# Patient Record
Sex: Male | Born: 1995 | Race: Black or African American | Hispanic: No | Marital: Single | Smoking: Current every day smoker
Health system: Southern US, Community
[De-identification: ages and names within clinical notes are randomized; demographics above are authoritative.]

---
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
## 2016-10-12 ENCOUNTER — Emergency Department (HOSPITAL_COMMUNITY)
Admission: EM | Admit: 2016-10-12 | Discharge: 2016-10-13 | Disposition: A | Discharge status: Home / Self Care | Payer: No Typology Code available for payment source | Attending: Emergency Medicine | Admitting: Emergency Medicine

## 2016-10-12 ENCOUNTER — Encounter (HOSPITAL_COMMUNITY): Payer: Self-pay | Admitting: Emergency Medicine

## 2016-10-12 ENCOUNTER — Emergency Department (HOSPITAL_COMMUNITY): Payer: No Typology Code available for payment source

## 2016-10-12 DIAGNOSIS — S0081XA Abrasion of other part of head, initial encounter: Secondary | ICD-10-CM | POA: Insufficient documentation

## 2016-10-12 DIAGNOSIS — S99921A Unspecified injury of right foot, initial encounter: Secondary | ICD-10-CM | POA: Diagnosis present

## 2016-10-12 DIAGNOSIS — S20212A Contusion of left front wall of thorax, initial encounter: Secondary | ICD-10-CM | POA: Insufficient documentation

## 2016-10-12 DIAGNOSIS — S93601A Unspecified sprain of right foot, initial encounter: Secondary | ICD-10-CM | POA: Insufficient documentation

## 2016-10-12 DIAGNOSIS — S20311A Abrasion of right front wall of thorax, initial encounter: Secondary | ICD-10-CM | POA: Diagnosis not present

## 2016-10-12 DIAGNOSIS — F17210 Nicotine dependence, cigarettes, uncomplicated: Secondary | ICD-10-CM | POA: Insufficient documentation

## 2016-10-12 DIAGNOSIS — S80811A Abrasion, right lower leg, initial encounter: Secondary | ICD-10-CM

## 2016-10-12 DIAGNOSIS — Y939 Activity, unspecified: Secondary | ICD-10-CM | POA: Insufficient documentation

## 2016-10-12 DIAGNOSIS — M791 Myalgia: Secondary | ICD-10-CM | POA: Insufficient documentation

## 2016-10-12 DIAGNOSIS — Y999 Unspecified external cause status: Secondary | ICD-10-CM | POA: Diagnosis not present

## 2016-10-12 DIAGNOSIS — Y92410 Unspecified street and highway as the place of occurrence of the external cause: Secondary | ICD-10-CM | POA: Diagnosis not present

## 2016-10-12 MED ORDER — METHOCARBAMOL 500 MG PO TABS: 500.0000 mg | ORAL_TABLET | Freq: Three times a day (TID) | ORAL | 0 refills | Status: DC | PRN

## 2016-10-12 MED ORDER — OXYCODONE-ACETAMINOPHEN 5-325 MG PO TABS
1.0000 | ORAL_TABLET | Freq: Once | ORAL | Status: AC
  Administered 2016-10-12: 1 via ORAL
  Filled 2016-10-12: qty 1
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
## 2016-10-13 ENCOUNTER — Telehealth: Payer: Self-pay | Admitting: *Deleted

## 2016-10-13 NOTE — ED Notes (Signed)
Pt alert & oriented x4, stable gait. Patient given discharge instructions, paperwork & prescription(s). Patient verbalized understanding. Pt left department in wheelchair escorted by staff. Pt left w/ no further questions. 
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
## 2016-11-01 ENCOUNTER — Other Ambulatory Visit (HOSPITAL_COMMUNITY): Payer: Self-pay | Admitting: Orthopaedic Surgery

## 2016-11-01 ENCOUNTER — Ambulatory Visit (HOSPITAL_COMMUNITY)
Admission: RE | Admit: 2016-11-01 | Discharge: 2016-11-01 | Disposition: A | Discharge status: Home / Self Care | Payer: Medicaid Other | Source: Ambulatory Visit | Attending: Orthopaedic Surgery | Admitting: Orthopaedic Surgery

## 2016-11-01 DIAGNOSIS — M542 Cervicalgia: Secondary | ICD-10-CM | POA: Diagnosis present
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
## 2018-03-25 ENCOUNTER — Emergency Department (HOSPITAL_COMMUNITY)
Admission: EM | Admit: 2018-03-25 | Discharge: 2018-03-25 | Disposition: A | Discharge status: Home / Self Care | Payer: Medicaid Other | Attending: Emergency Medicine | Admitting: Emergency Medicine

## 2018-03-25 ENCOUNTER — Encounter (HOSPITAL_COMMUNITY): Payer: Self-pay | Admitting: Emergency Medicine

## 2018-03-25 ENCOUNTER — Other Ambulatory Visit: Payer: Self-pay

## 2018-03-25 ENCOUNTER — Emergency Department (HOSPITAL_COMMUNITY): Payer: Medicaid Other

## 2018-03-25 DIAGNOSIS — N5089 Other specified disorders of the male genital organs: Secondary | ICD-10-CM | POA: Insufficient documentation

## 2018-03-25 DIAGNOSIS — N453 Epididymo-orchitis: Secondary | ICD-10-CM | POA: Insufficient documentation

## 2018-03-25 DIAGNOSIS — F17210 Nicotine dependence, cigarettes, uncomplicated: Secondary | ICD-10-CM | POA: Insufficient documentation

## 2018-03-25 LAB — URINALYSIS, ROUTINE W REFLEX MICROSCOPIC
Bilirubin Urine: NEGATIVE
GLUCOSE, UA: NEGATIVE mg/dL
HGB URINE DIPSTICK: NEGATIVE
Ketones, ur: NEGATIVE mg/dL
Nitrite: NEGATIVE
Protein, ur: NEGATIVE mg/dL
SPECIFIC GRAVITY, URINE: 1.023 (ref 1.005–1.030)
pH: 6 (ref 5.0–8.0)

## 2018-03-25 MED ORDER — STERILE WATER FOR INJECTION IJ SOLN
INTRAMUSCULAR | Status: DC
Start: 2018-03-25 — End: 2018-03-26
  Filled 2018-03-25: qty 10

## 2018-03-25 MED ORDER — DOXYCYCLINE HYCLATE 100 MG PO CAPS: 100.0000 mg | ORAL_CAPSULE | Freq: Two times a day (BID) | ORAL | 0 refills | Status: DC

## 2018-03-25 MED ORDER — CEFTRIAXONE SODIUM 250 MG IJ SOLR
250.0000 mg | Freq: Once | INTRAMUSCULAR | Status: AC
Start: 2018-03-25 — End: 2018-03-25
  Administered 2018-03-25: 250 mg via INTRAMUSCULAR
  Filled 2018-03-25: qty 250

## 2018-03-25 MED ORDER — DOXYCYCLINE HYCLATE 100 MG PO TABS
100.0000 mg | ORAL_TABLET | Freq: Once | ORAL | Status: AC
  Administered 2018-03-25: 100 mg via ORAL
  Filled 2018-03-25: qty 1

## 2018-03-25 NOTE — ED Provider Notes (Signed)
[facility] EMERGENCY DEPARTMENT Provider Note   CSN: [id number] Arrival date & time: 03/25/18  2104     History   Chief Complaint Chief Complaint  Patient presents with  . Groin Swelling    HPI Nicholas Shannon is a 22 y.o. male.  He presents for evaluation of pain and swelling in left testicle, for 1 week.  No known trauma.  No urethral discharge.  He also has pain in the left groin region.  He denies back pain, fever, chills, dysuria, nausea, vomiting, weakness or dizziness.  There are no other known modifying factors.   HPI  History reviewed. No pertinent past medical history.  There are no active problems to display for this patient.   History reviewed. No pertinent surgical history.      Home Medications    Prior to Admission medications   Medication Sig Start Date End Date Taking? Authorizing Provider  methocarbamol (ROBAXIN) 500 MG tablet Take 1 tablet (500 mg total) by mouth every 8 (eight) hours as needed for muscle spasms. 10/12/16   Benjiman Core, MD    Family History Family History  Problem Relation Age of Onset  . Diverticulitis Mother   . Sarcoidosis Father     Social History Social History   Tobacco Use  . Smoking status: Current Every Day Smoker    Packs/day: 0.50    Types: Cigarettes  . Smokeless tobacco: Never Used  Substance Use Topics  . Alcohol use: Yes    Comment: occas  . Drug use: No     Allergies   Patient has no known allergies.   Review of Systems Review of Systems  All other systems reviewed and are negative.    Physical Exam Updated Vital Signs BP (!) 156/74 (BP Location: Right Arm)   Pulse 71   Temp 98.5 F (36.9 C) (Oral)   Ht  (1.854 m)   Wt 117.9 kg (260 lb)   SpO2 100%   BMI 34.30 kg/m   Physical Exam  Constitutional: He is oriented to person, place, and time. He appears well-developed. No distress.  Overweight  HENT:  Head: Normocephalic and atraumatic.  Right Ear: External ear normal.    Left Ear: External ear normal.  Eyes: Pupils are equal, round, and reactive to light. Conjunctivae and EOM are normal.  Neck: Normal range of motion and phonation normal. Neck supple.  Cardiovascular: Normal rate.  Pulmonary/Chest: Effort normal. He exhibits no bony tenderness.  Abdominal: Soft. There is no tenderness.  No left groin mass, tenderness or swelling.  Genitourinary:  Genitourinary Comments: Circumcised.  Normal penis.  No urethral discharge.  Mass left hemiscrotum, 4 x 4 x 3 cm.  This mass is moderately tender.  Unable to appreciate testicle by palpation.  Normal right testes.  No erythema of the scrotum.  Musculoskeletal: Normal range of motion.  Neurological: He is alert and oriented to person, place, and time. No cranial nerve deficit or sensory deficit. He exhibits normal muscle tone. Coordination normal.  Skin: Skin is warm, dry and intact.  Psychiatric: He has a normal mood and affect. His behavior is normal. Judgment and thought content normal.  Nursing note and vitals reviewed.    ED Treatments / Results  Labs (all labs ordered are listed, but only abnormal results are displayed) Labs Reviewed  URINALYSIS, ROUTINE W REFLEX MICROSCOPIC    EKG None  Radiology No results found.  Procedures Procedures (including critical care time)  Medications Ordered in ED Medications - No data  to display   Initial Impression / Assessment and Plan / ED Course  I have reviewed the triage vital signs and the nursing notes.  Pertinent labs & imaging results that were available during my care of the patient were reviewed by me and considered in my medical decision making (see chart for details).  Clinical Course as of Mar 25 2125  Sat Mar 25, 2018  2125 Ultrasound ordered to evaluate for possibility of left testicular torsion.   [EW]    Clinical Course User Index [EW] Mancel Bale, MD     Patient Vitals for the past 24 hrs:  BP Temp Temp src Pulse SpO2 Height  Weight  03/25/18 2115 - - - - -  (1.854 m) 117.9 kg (260 lb)  03/25/18 2112 (!) 156/74 98.5 F (36.9 C) Oral 71 100 % - -    Medical decision making-urgent evaluation with ultrasound required for possibility of testicular mass.  No urethral discharge.  Doubt urethritis or STD.  Nursing Notes Reviewed/ Care Coordinated Applicable Imaging Reviewed Interpretation of Laboratory Data incorporated into ED treatment   Care to oncoming provider team to evaluate after return of testing.     Final Clinical Impressions(s) / ED Diagnoses   Final diagnoses:  Scrotal mass    ED Discharge Orders    None       Mancel Bale, MD 03/25/18 2127

## 2018-03-25 NOTE — ED Provider Notes (Signed)
With 1 week history of testicular tenderness.  Signed out to me pending ultrasound.  Ultrasound consistent with epididymal orchitis and hydrocele.  Will test for GC chlamydia.  Plan treatment with Rocephin and doxycycline.  Patient referred for follow-up with urology.   Margarita Grizzle, MD 03/25/18 [phone]

## 2018-03-25 NOTE — ED Triage Notes (Signed)
Patient reports L testicle swelling that started last week after playing basketball.

## 2018-03-25 NOTE — Discharge Instructions (Signed)
Please take all medication as prescribed.  Follow up with urology this week.

## 2018-03-27 LAB — GC/CHLAMYDIA PROBE AMP (~~LOC~~) NOT AT ARMC
CHLAMYDIA, DNA PROBE: POSITIVE — AB
NEISSERIA GONORRHEA: NEGATIVE

## 2019-12-11 IMAGING — US US SCROTUM W/ DOPPLER COMPLETE
1 series · 14 of 25 positions shown · non-contrast
Comparison: None.

CLINICAL DATA: Left scrotal swelling and pain 1 week.

EXAM:
SCROTAL ULTRASOUND
DOPPLER ULTRASOUND OF THE TESTICLES
TECHNIQUE: Complete ultrasound examination of the testicles, epididymis, and
other scrotal structures was performed. Color and spectral Doppler
ultrasound were also utilized to evaluate blood flow to the
testicles.

[Series 1: us scrotum w/ doppler complete · 0.07mm/px · 14 of 57 slices shown]
[im 1/57]
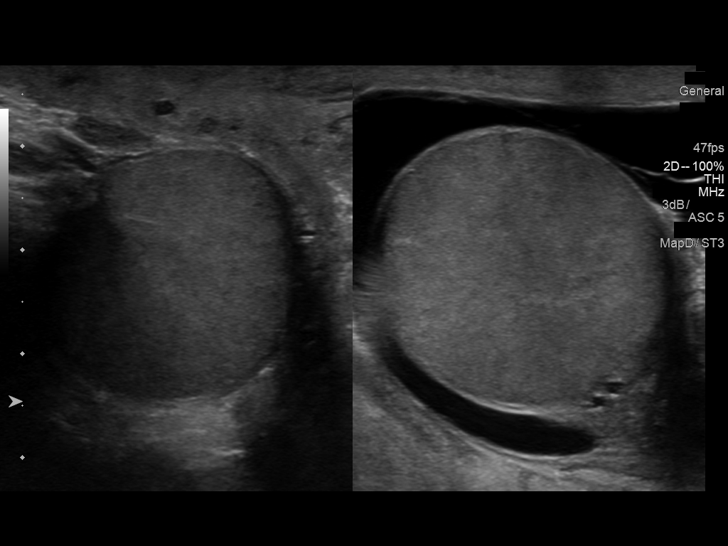
[im 5/57]
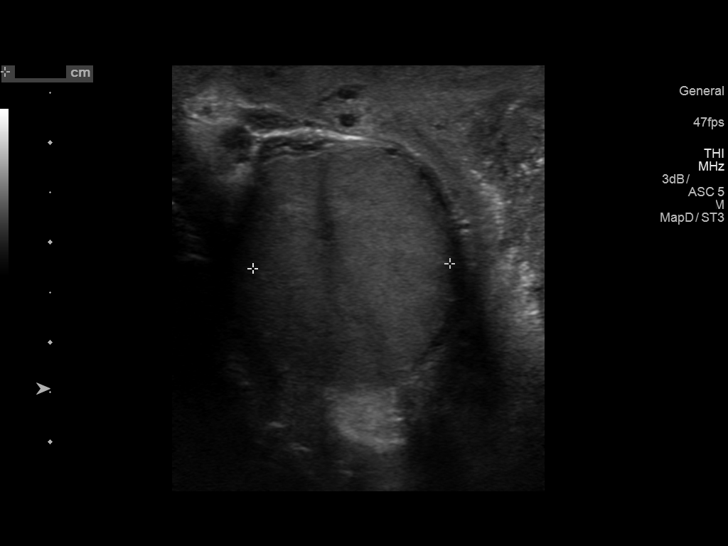
[im 10/57]
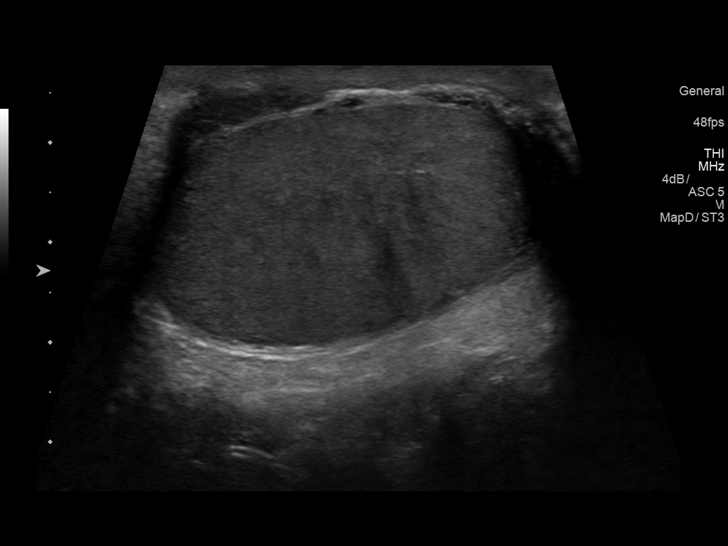
[im 15/57]
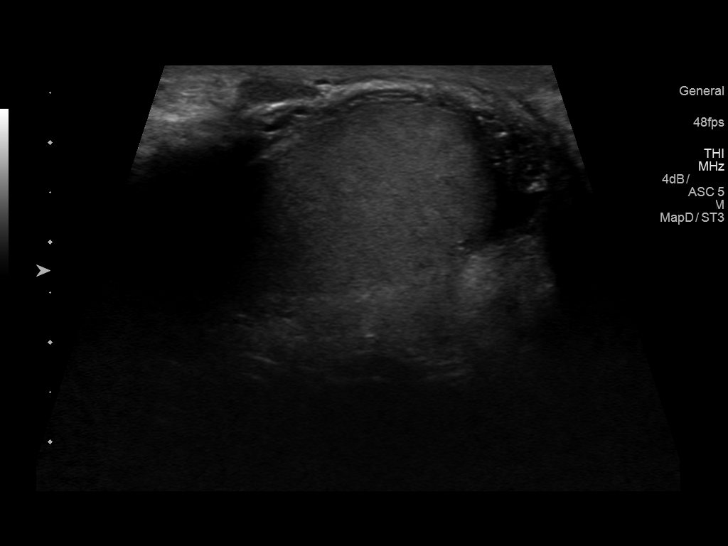
[im 19/57]
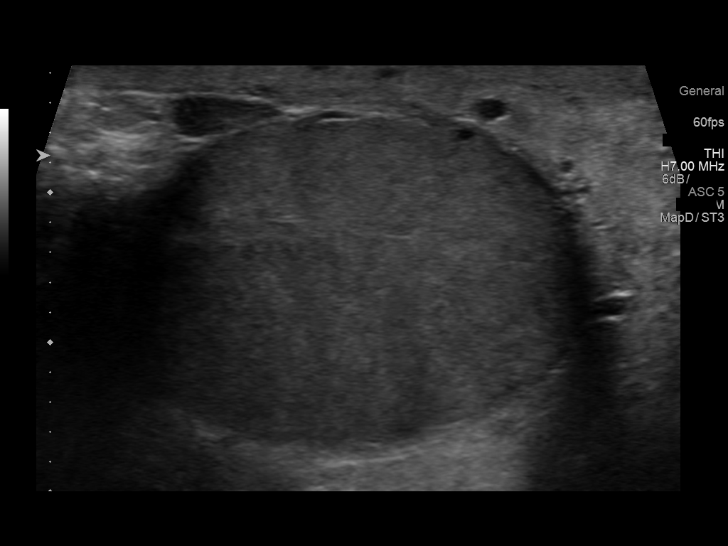
[im 22/57]
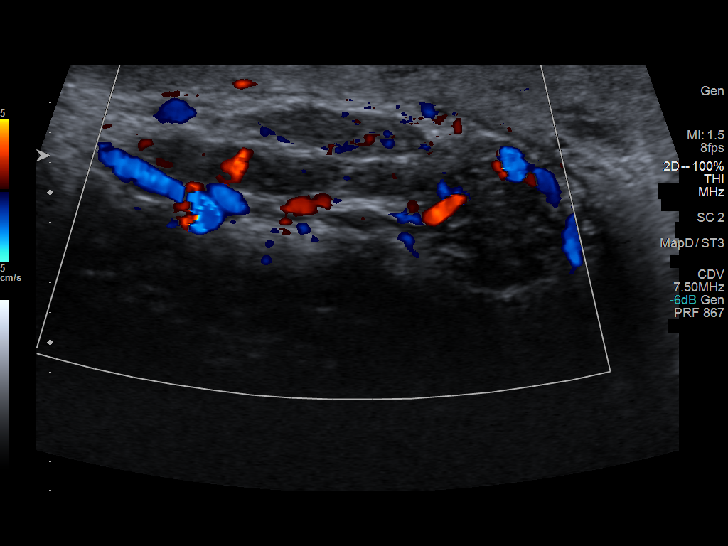
[im 26/57]
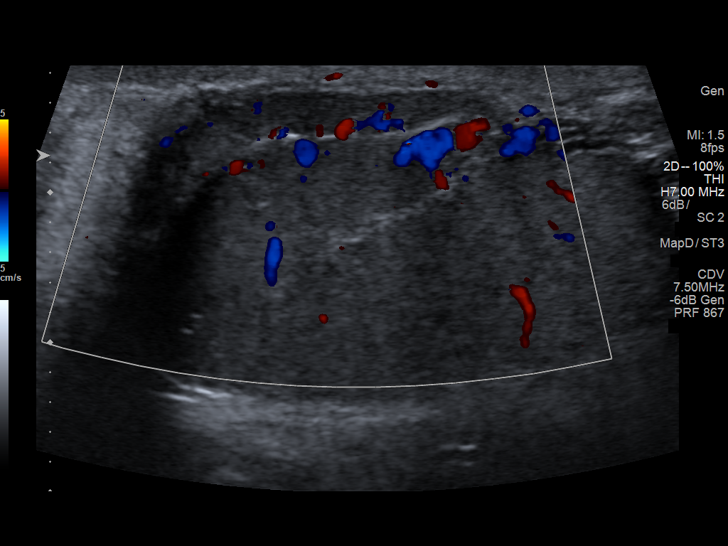
[im 31/57]
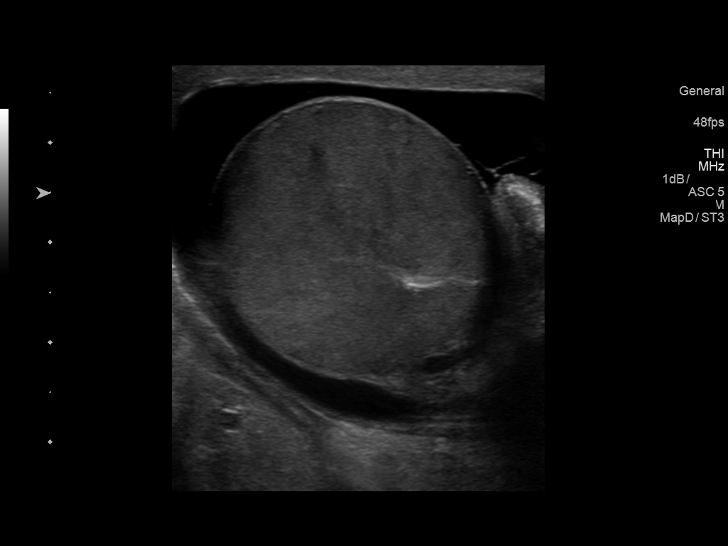
[im 36/57]
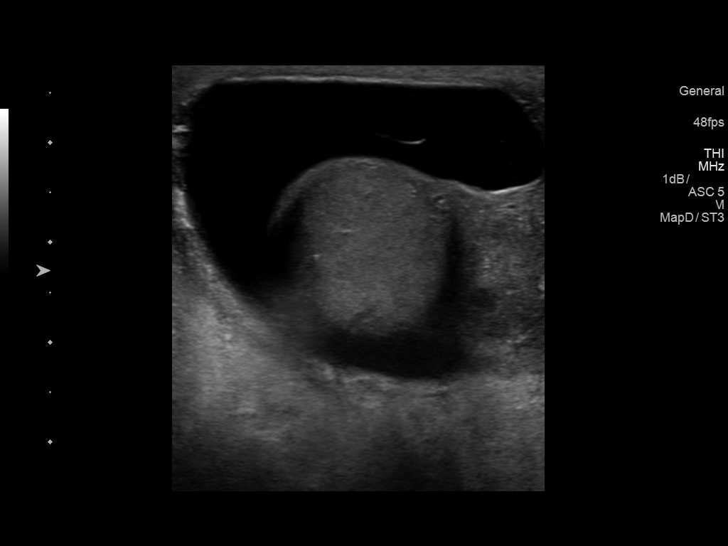
[im 38/57]
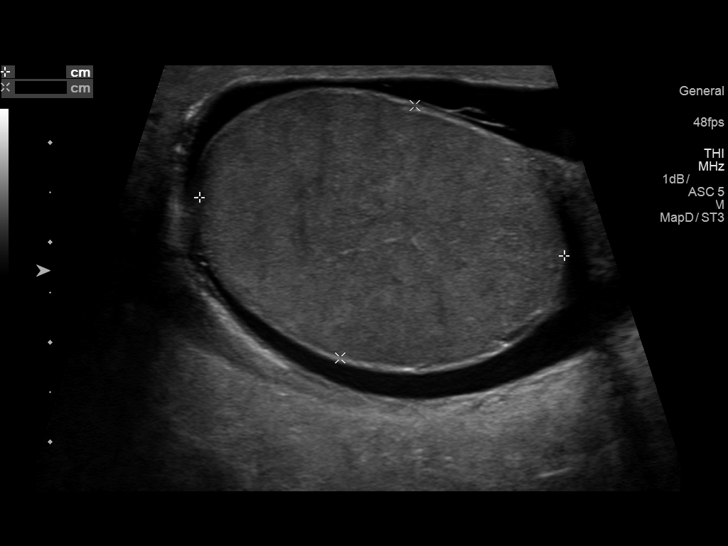
[im 43/57]
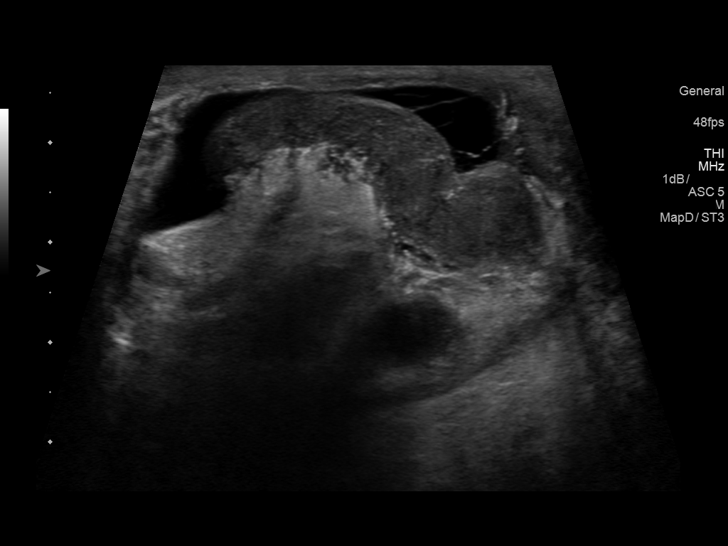
[im 47/57]
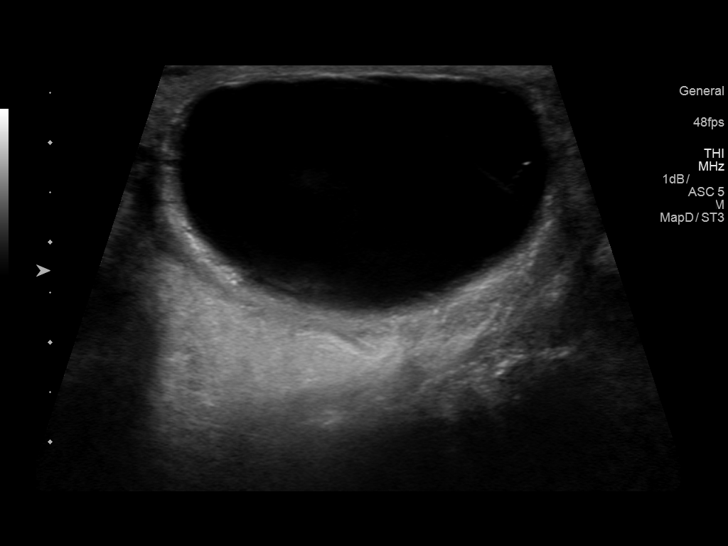
[im 52/57]
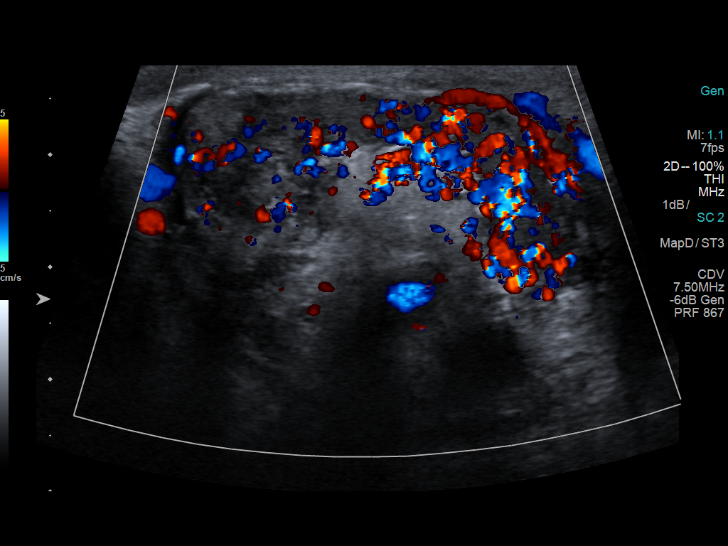
[im 57/57]
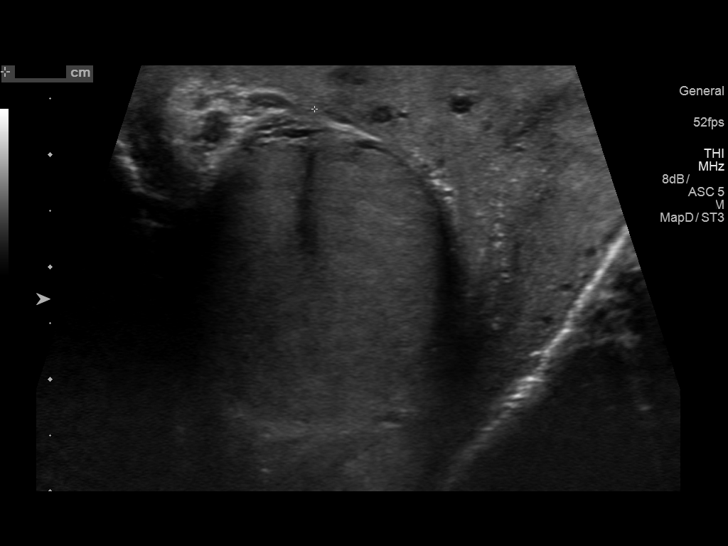

[14 of 25 positions shown; findings below may reference images not displayed]

FINDINGS: Right testicle

Measurements: 2.3 x 2.0 x 3.7 cm. No mass or microlithiasis
visualized.

Left testicle

Measurements: 2.6 x 2.7 x 3.7 cm. No mass or microlithiasis
visualized.

Right epididymis:  Normal in size and appearance.

Left epididymis:  Increased vascularity.

Hydrocele:  Small to moderate left hydrocele.

Varicocele:  None visualized.

Pulsed Doppler interrogation of both testes demonstrates normal low
resistance arterial and venous waveforms bilaterally with slightly
increased flow to the left testicle versus the right..
IMPRESSION: Findings suggesting left-sided acute epididymitis with possible
epididymal orchitis. Small mild-to-moderate associated hydrocele.

## 2021-09-17 ENCOUNTER — Emergency Department (HOSPITAL_COMMUNITY)
Admission: EM | Admit: 2021-09-17 | Discharge: 2021-09-17 | Disposition: A | Discharge status: Home / Self Care | Payer: Medicaid Other | Attending: Emergency Medicine | Admitting: Emergency Medicine

## 2021-09-17 ENCOUNTER — Encounter (HOSPITAL_COMMUNITY): Payer: Self-pay | Admitting: *Deleted

## 2021-09-17 DIAGNOSIS — M545 Low back pain, unspecified: Secondary | ICD-10-CM

## 2021-09-17 DIAGNOSIS — F17210 Nicotine dependence, cigarettes, uncomplicated: Secondary | ICD-10-CM | POA: Insufficient documentation

## 2021-09-17 MED ORDER — CYCLOBENZAPRINE HCL 10 MG PO TABS: 10.0000 mg | ORAL_TABLET | Freq: Two times a day (BID) | ORAL | 0 refills | Status: DC | PRN

## 2021-09-17 NOTE — ED Triage Notes (Signed)
Back pain x 4 days.  

## 2021-09-17 NOTE — ED Provider Notes (Signed)
[facility] EMERGENCY DEPARTMENT Provider Note   CSN: [id number] Arrival date & time: 09/17/21  1208     History Chief Complaint  Patient presents with   Back Pain    Nicholas Shannon is a 25 y.o. male.  HPI  Patient with no significant medical history presents to the emergency department with chief complaint of lower back pain, started approximately 3 days ago, started after he woke up and got out of bed, states it remains in his lower back, does not radiate, it is worsened with bending or moving, he has no saddle paresthesias, paresthesia or weakness in lower extremities, he denies urinary incontinency, retention, difficult bowel movements, he denies  urinary symptoms or flank tenderness.  He denies associated trauma to the area, he states that the pain started after he was moving mulch the day prior, he has no associated fevers, chills, chest pain, shortness of breath, worsening pedal edema, he denies history of IV drug use.  He states he has not had anything for pain at this time.  He denies any alleviating factors.  History reviewed. No pertinent past medical history.  There are no problems to display for this patient.   History reviewed. No pertinent surgical history.     Family History  Problem Relation Age of Onset   Diverticulitis Mother    Sarcoidosis Father     Social History   Tobacco Use   Smoking status: Every Day    Packs/day: 0.50    Types: Cigarettes   Smokeless tobacco: Never  Vaping Use   Vaping Use: Every day  Substance Use Topics   Alcohol use: Yes    Comment: occas   Drug use: No    Home Medications Prior to Admission medications   Medication Sig Start Date End Date Taking? Authorizing Provider  cyclobenzaprine (FLEXERIL) 10 MG tablet Take 1 tablet (10 mg total) by mouth 2 (two) times daily as needed for muscle spasms. 09/17/21  Yes Carroll Sage, PA-C  doxycycline (VIBRAMYCIN) 100 MG capsule Take 1 capsule (100 mg total) by mouth 2 (two)  times daily. 03/25/18   Margarita Grizzle, MD    Allergies    Patient has no known allergies.  Review of Systems   Review of Systems  Constitutional:  Negative for chills and fever.  HENT:  Negative for congestion.   Respiratory:  Negative for shortness of breath.   Cardiovascular:  Negative for chest pain.  Gastrointestinal:  Negative for abdominal pain.  Genitourinary:  Negative for enuresis.  Musculoskeletal:  Positive for back pain.  Skin:  Negative for rash.  Neurological:  Negative for dizziness.  Hematological:  Does not bruise/bleed easily.   Physical Exam Updated Vital Signs BP (!) 142/90 (BP Location: Right Arm)   Pulse (!) 56   Temp 98.1 F (36.7 C) (Oral)   Resp 17   Ht 6\' 1"  (1.854 m)   Wt 126.6 kg   SpO2 100%   BMI 36.82 kg/m   Physical Exam Vitals and nursing note reviewed.  Constitutional:      General: He is not in acute distress.    Appearance: He is not ill-appearing.  HENT:     Head: Normocephalic and atraumatic.     Nose: No congestion.  Eyes:     Conjunctiva/sclera: Conjunctivae normal.  Cardiovascular:     Rate and Rhythm: Normal rate and regular rhythm.     Pulses: Normal pulses.     Heart sounds: No murmur heard.   No friction rub.  No gallop.  Pulmonary:     Effort: No respiratory distress.     Breath sounds: No wheezing, rhonchi or rales.  Musculoskeletal:     Comments: Spine was palpated was nontender to palpation, no step-off or deformities present, there is no noted skin changes present along patient's spine.  patient does have pain along the perispinal muscles in the lower lumbar region, he has full range of motion, 5 of 5 strength, neurovascularly intact in lower extremities.  He has 2+ patellar reflexes.  Skin:    General: Skin is warm and dry.  Neurological:     Mental Status: He is alert.  Psychiatric:        Mood and Affect: Mood normal.    ED Results / Procedures / Treatments   Labs (all labs ordered are listed, but only  abnormal results are displayed) Labs Reviewed - No data to display  EKG None  Radiology No results found.  Procedures Procedures   Medications Ordered in ED Medications - No data to display  ED Course  I have reviewed the triage vital signs and the nursing notes.  Pertinent labs & imaging results that were available during my care of the patient were reviewed by me and considered in my medical decision making (see chart for details).    MDM Rules/Calculators/A&P                          Initial impression-patient presents with lower back pain.  He is alert, does not appear in acute stress, vital signs are reassuring.  Work-up-due to well-appearing patient, benign physical exam, further lab work and imaging not warranted at this time.  Rule out- I have low suspicion for spinal fracture or spinal cord abnormality as patient denies urinary incontinency, retention, difficulty with bowel movements, denies saddle paresthesias.  Spine was palpated there is no step-off, crepitus or gross deformities felt, patient had 5/5 strength, full range of motion, neurovascular fully intact in the lower extremities.  Imaging will be deferred as there is no associated trauma with this injury, no gross deformities present my exam, very low likelihood of fractures at this time. Low suspicion for septic arthritis as patient denies IV drug use, skin exam was performed no erythematous, edema or warm joints noted.   Plan-  Back pain-likely a muscular strain, will recommend over-the-counter pain medications, provide him with muscle relaxers, stretching of the back, follow-up with PCP for further evaluation.  Vital signs have remained stable, no indication for hospital admission.  Patient given at home care as well strict return precautions.  Patient verbalized that they understood agreed to said plan.  Final Clinical Impression(s) / ED Diagnoses Final diagnoses:  Acute bilateral low back pain without  sciatica    Rx / DC Orders ED Discharge Orders          Ordered    cyclobenzaprine (FLEXERIL) 10 MG tablet  2 times daily PRN        09/17/21 1412             Carroll Sage, PA-C 09/17/21 1413    Bethann Berkshire, MD 09/19/21 [phone]

## 2021-09-17 NOTE — Discharge Instructions (Addendum)
You have been seen here for back I recommend taking over-the-counter pain medications like ibuprofen and/or Tylenol every 6 as needed.  Please follow dosage and on the back of bottle.  I also recommend applying heat to the area and stretching out the muscles as this will help decrease stiffness and pain.  I have given you information on exercises please follow. given you a prescription for a muscle relaxer this can make you drowsy do not consume alcohol or operate heavy machinery when taking this medication.   Please follow-up with your PCP for further evaluation.  Come back to the emergency department if you develop chest pain, shortness of breath, severe abdominal pain, uncontrolled nausea, vomiting, diarrhea.

## 2021-10-08 ENCOUNTER — Emergency Department (HOSPITAL_COMMUNITY)
Admission: EM | Admit: 2021-10-08 | Discharge: 2021-10-08 | Disposition: A | Discharge status: Home / Self Care | Payer: Medicaid Other | Attending: Emergency Medicine | Admitting: Emergency Medicine

## 2021-10-08 ENCOUNTER — Encounter (HOSPITAL_COMMUNITY): Payer: Self-pay | Admitting: *Deleted

## 2021-10-08 DIAGNOSIS — F17210 Nicotine dependence, cigarettes, uncomplicated: Secondary | ICD-10-CM | POA: Insufficient documentation

## 2021-10-08 DIAGNOSIS — Z20822 Contact with and (suspected) exposure to covid-19: Secondary | ICD-10-CM | POA: Insufficient documentation

## 2021-10-08 DIAGNOSIS — J3489 Other specified disorders of nose and nasal sinuses: Secondary | ICD-10-CM | POA: Insufficient documentation

## 2021-10-08 DIAGNOSIS — J101 Influenza due to other identified influenza virus with other respiratory manifestations: Secondary | ICD-10-CM | POA: Insufficient documentation

## 2021-10-08 LAB — RESP PANEL BY RT-PCR (FLU A&B, COVID) ARPGX2
Influenza A by PCR: POSITIVE — AB
Influenza B by PCR: NEGATIVE
SARS Coronavirus 2 by RT PCR: NEGATIVE

## 2021-10-08 MED ORDER — OSELTAMIVIR PHOSPHATE 75 MG PO CAPS: 75.0000 mg | ORAL_CAPSULE | Freq: Two times a day (BID) | ORAL | 0 refills | Status: DC

## 2021-10-08 NOTE — ED Provider Notes (Signed)
[facility] EMERGENCY DEPARTMENT Provider Note   CSN: [id number] Arrival date & time: 10/08/21  1023     History Chief Complaint  Patient presents with   FLU SYMPTOMS    Nicholas Shannon is a 25 y.o. male  presenting with a 2 day history of uri type symptoms which includes nasal congestion with clear rhinorrhea, sore throat, low grade fever, body aches, eye soreness and nonproductive cough.  Symptoms do not include sinus pain, shortness of breath, chest pain,  Nausea, vomiting, abdominal pain, diarrhea, rash, neck pain or stiffness.  The patient has taken advil yesterday with transient but  significant improvement in symptoms.   The history is provided by the patient.      History reviewed. No pertinent past medical history.  There are no problems to display for this patient.   History reviewed. No pertinent surgical history.     Family History  Problem Relation Age of Onset   Diverticulitis Mother    Sarcoidosis Father     Social History   Tobacco Use   Smoking status: Every Day    Packs/day: 0.50    Types: Cigarettes   Smokeless tobacco: Never  Vaping Use   Vaping Use: Every day  Substance Use Topics   Alcohol use: Yes    Comment: occas   Drug use: No    Home Medications Prior to Admission medications   Medication Sig Start Date End Date Taking? Authorizing Provider  oseltamivir (TAMIFLU) 75 MG capsule Take 1 capsule (75 mg total) by mouth every 12 (twelve) hours. 10/08/21  Yes Mikena Masoner, Raynelle Fanning, PA-C  cyclobenzaprine (FLEXERIL) 10 MG tablet Take 1 tablet (10 mg total) by mouth 2 (two) times daily as needed for muscle spasms. 09/17/21   Carroll Sage, PA-C  doxycycline (VIBRAMYCIN) 100 MG capsule Take 1 capsule (100 mg total) by mouth 2 (two) times daily. 03/25/18   Margarita Grizzle, MD    Allergies    Patient has no known allergies.  Review of Systems   Review of Systems  Constitutional:  Positive for chills and fever.  HENT:  Positive for rhinorrhea and  sore throat. Negative for ear pain, sinus pressure, trouble swallowing and voice change.   Eyes:  Positive for pain. Negative for discharge and visual disturbance.  Respiratory:  Positive for cough. Negative for shortness of breath, wheezing and stridor.   Cardiovascular:  Negative for chest pain.  Gastrointestinal:  Negative for abdominal pain.  Genitourinary: Negative.   Musculoskeletal:  Positive for myalgias. Negative for neck pain and neck stiffness.  Skin:  Negative for rash.  Neurological:  Positive for headaches.  All other systems reviewed and are negative.  Physical Exam Updated Vital Signs BP (!) 142/93 (BP Location: Right Arm)   Pulse 91   Temp 100.1 F (37.8 C) (Oral)   Resp 18   SpO2 100%   Physical Exam Constitutional:      Appearance: He is well-developed.  HENT:     Head: Normocephalic and atraumatic.     Right Ear: Tympanic membrane and ear canal normal.     Left Ear: Tympanic membrane and ear canal normal.     Nose: Mucosal edema and rhinorrhea present.     Mouth/Throat:     Mouth: Mucous membranes are moist.     Pharynx: Oropharynx is clear. Uvula midline. No oropharyngeal exudate or posterior oropharyngeal erythema.     Tonsils: No tonsillar abscesses.  Eyes:     Conjunctiva/sclera: Conjunctivae normal.  Cardiovascular:  Rate and Rhythm: Normal rate.     Heart sounds: Normal heart sounds.  Pulmonary:     Effort: Pulmonary effort is normal. No respiratory distress.     Breath sounds: No wheezing, rhonchi or rales.  Abdominal:     Palpations: Abdomen is soft.     Tenderness: There is no abdominal tenderness.  Musculoskeletal:        General: Normal range of motion.     Cervical back: Normal range of motion. No rigidity.  Skin:    General: Skin is warm and dry.     Findings: No rash.  Neurological:     Mental Status: He is alert and oriented to person, place, and time.    ED Results / Procedures / Treatments   Labs (all labs ordered are  listed, but only abnormal results are displayed) Labs Reviewed  RESP PANEL BY RT-PCR (FLU A&B, COVID) ARPGX2 - Abnormal; Notable for the following components:      Result Value   Influenza A by PCR POSITIVE (*)    All other components within normal limits    EKG None  Radiology No results found.  Procedures Procedures   Medications Ordered in ED Medications - No data to display  ED Course  I have reviewed the triage vital signs and the nursing notes.  Pertinent labs & imaging results that were available during my care of the patient were reviewed by me and considered in my medical decision making (see chart for details).    MDM Rules/Calculators/A&P                           Patient presenting with flulike symptoms, no respiratory distress has no respiratory distress,   We discussed home treatment of  symptoms and close return precautions for any worsening symptoms, discussed Tamiflu which patient is interested in taking, prescription provided.    Final Clinical Impression(s) / ED Diagnoses Final diagnoses:  Influenza A    Rx / DC Orders ED Discharge Orders          Ordered    oseltamivir (TAMIFLU) 75 MG capsule  Every 12 hours        10/08/21 1320             Evalee Jefferson, PA-C 10/08/21 1327    Hayden Rasmussen, MD 10/08/21 2118

## 2021-10-08 NOTE — Discharge Instructions (Signed)
You have tested positive for the flu.  Rest make sure you are drinking plenty of fluids.  Recommend continuing using either Advil or Tylenol to help with fever and body aches.  You have been prescribed Tamiflu.  You may take this medication if you choose, it may help improve your symptoms a little bit quicker than if you do not take it but it is not necessary as discussed.  If you decide to take it, you need to start it today as this is the second day of your illness, waiting till tomorrow will cause this medication to be less effective.  Get rechecked for any severe weakness or development of shortness of breath.

## 2021-10-08 NOTE — ED Triage Notes (Signed)
FLU SYMPTOMS

## 2023-04-30 DIAGNOSIS — Z419 Encounter for procedure for purposes other than remedying health state, unspecified: Secondary | ICD-10-CM | POA: Diagnosis not present

## 2023-05-30 DIAGNOSIS — Z419 Encounter for procedure for purposes other than remedying health state, unspecified: Secondary | ICD-10-CM | POA: Diagnosis not present

## 2023-06-30 DIAGNOSIS — Z419 Encounter for procedure for purposes other than remedying health state, unspecified: Secondary | ICD-10-CM | POA: Diagnosis not present

## 2023-07-31 DIAGNOSIS — Z419 Encounter for procedure for purposes other than remedying health state, unspecified: Secondary | ICD-10-CM | POA: Diagnosis not present

## 2023-08-30 DIAGNOSIS — Z419 Encounter for procedure for purposes other than remedying health state, unspecified: Secondary | ICD-10-CM | POA: Diagnosis not present

## 2023-09-30 DIAGNOSIS — Z419 Encounter for procedure for purposes other than remedying health state, unspecified: Secondary | ICD-10-CM | POA: Diagnosis not present

## 2023-10-30 DIAGNOSIS — Z419 Encounter for procedure for purposes other than remedying health state, unspecified: Secondary | ICD-10-CM | POA: Diagnosis not present

## 2023-11-08 ENCOUNTER — Ambulatory Visit
Admission: EM | Admit: 2023-11-08 | Discharge: 2023-11-08 | Disposition: A | Discharge status: Home / Self Care | Payer: Medicaid Other | Attending: Family Medicine | Admitting: Family Medicine

## 2023-11-08 DIAGNOSIS — B372 Candidiasis of skin and nail: Secondary | ICD-10-CM

## 2023-11-08 DIAGNOSIS — L0291 Cutaneous abscess, unspecified: Secondary | ICD-10-CM

## 2023-11-08 DIAGNOSIS — R0789 Other chest pain: Secondary | ICD-10-CM | POA: Diagnosis not present

## 2023-11-08 MED ORDER — CEPHALEXIN 500 MG PO CAPS: 500.0000 mg | ORAL_CAPSULE | Freq: Two times a day (BID) | ORAL | 0 refills | Status: DC

## 2023-11-08 MED ORDER — NYSTATIN 100000 UNIT/GM EX CREA: TOPICAL_CREAM | CUTANEOUS | 0 refills | Status: AC

## 2023-11-08 NOTE — ED Provider Notes (Signed)
[facility]    CSN: [id number] Arrival date & time: 11/08/23  0272      History   Chief Complaint Chief Complaint  Patient presents with   Abscess    HPI Nicholas Shannon is a 27 y.o. male.   Patient presenting today with several day history of a boil to the lower abdomen.  Has been trying to keep it clean but otherwise not tried anything over-the-counter for symptoms.  No known injury to the area, fever, chills, drainage, bleeding.  Notes he tends to have an itchy rash to this area chronically where his abdomen meets his groin region, not trying anything for the symptoms.    History reviewed. No pertinent past medical history.  There are no problems to display for this patient.   History reviewed. No pertinent surgical history.     Home Medications    Prior to Admission medications   Medication Sig Start Date End Date Taking? Authorizing Provider  cephALEXin (KEFLEX) 500 MG capsule Take 1 capsule (500 mg total) by mouth 2 (two) times daily. 11/08/23  Yes Particia Nearing, PA-C  nystatin cream (MYCOSTATIN) Apply to affected area 2 times daily 11/08/23  Yes Particia Nearing, PA-C  cyclobenzaprine (FLEXERIL) 10 MG tablet Take 1 tablet (10 mg total) by mouth 2 (two) times daily as needed for muscle spasms. 09/17/21   Carroll Sage, PA-C  doxycycline (VIBRAMYCIN) 100 MG capsule Take 1 capsule (100 mg total) by mouth 2 (two) times daily. 03/25/18   Margarita Grizzle, MD  oseltamivir (TAMIFLU) 75 MG capsule Take 1 capsule (75 mg total) by mouth every 12 (twelve) hours. 10/08/21   Burgess Amor, PA-C    Family History Family History  Problem Relation Age of Onset   Diverticulitis Mother    Sarcoidosis Father     Social History Social History   Tobacco Use   Smoking status: Every Day    Current packs/day: 0.50    Types: Cigarettes   Smokeless tobacco: Never  Vaping Use   Vaping status: Every Day  Substance Use Topics   Alcohol use: Yes     Comment: occas   Drug use: No     Allergies   Patient has no known allergies.   Review of Systems Review of Systems PER HPI  Physical Exam Triage Vital Signs ED Triage Vitals  Encounter Vitals Group     BP 11/08/23 1017 (!) 142/87     Systolic BP Percentile --      Diastolic BP Percentile --      Pulse Rate 11/08/23 1017 74     Resp 11/08/23 1017 14     Temp 11/08/23 1017 (!) 97.4 F (36.3 C)     Temp Source 11/08/23 1017 Oral     SpO2 11/08/23 1017 97 %     Weight --      Height --      Head Circumference --      Peak Flow --      Pain Score 11/08/23 1022 7     Pain Loc --      Pain Education --      Exclude from Growth Chart --    No data found.  Updated Vital Signs BP (!) 142/87 (BP Location: Right Arm)   Pulse 74   Temp (!) 97.4 F (36.3 C) (Oral)   Resp 14   SpO2 97%   Visual Acuity Right Eye Distance:   Left Eye Distance:   Bilateral Distance:  Right Eye Near:   Left Eye Near:    Bilateral Near:     Physical Exam Vitals and nursing note reviewed.  Constitutional:      Appearance: Normal appearance.  HENT:     Head: Atraumatic.  Eyes:     Extraocular Movements: Extraocular movements intact.     Conjunctiva/sclera: Conjunctivae normal.  Cardiovascular:     Rate and Rhythm: Normal rate and regular rhythm.  Pulmonary:     Effort: Pulmonary effort is normal.     Breath sounds: Normal breath sounds.  Musculoskeletal:        General: Normal range of motion.     Cervical back: Normal range of motion and neck supple.  Skin:    General: Skin is warm.     Comments: Hyper pigmented and irritated region below the pannus, 1 cm semifluctuant tender mass to the central pannus without erythema, induration, drainage or bleeding  Neurological:     General: No focal deficit present.     Mental Status: He is oriented to person, place, and time.     Motor: No weakness.     Gait: Gait normal.  Psychiatric:        Mood and Affect: Mood normal.         Thought Content: Thought content normal.        Judgment: Judgment normal.      UC Treatments / Results  Labs (all labs ordered are listed, but only abnormal results are displayed) Labs Reviewed - No data to display  EKG   Radiology No results found.  Procedures Procedures (including critical care time)  Medications Ordered in UC Medications - No data to display  Initial Impression / Assessment and Plan / UC Course  I have reviewed the triage vital signs and the nursing notes.  Pertinent labs & imaging results that were available during my care of the patient were reviewed by me and considered in my medical decision making (see chart for details).     Suspect ongoing yeast dermatitis to the region of the pannus now with secondary abscess to the area forming.  Treat with Keflex, nystatin cream, antifungal powders and good hygiene in this area.  Return for worsening symptoms.  Final Clinical Impressions(s) / UC Diagnoses   Final diagnoses:  Abscess  Yeast dermatitis  Chest wall pain   Discharge Instructions   None    ED Prescriptions     Medication Sig Dispense Auth. Provider   nystatin cream (MYCOSTATIN) Apply to affected area 2 times daily 60 g Particia Nearing, PA-C   cephALEXin (KEFLEX) 500 MG capsule Take 1 capsule (500 mg total) by mouth 2 (two) times daily. 14 capsule Particia Nearing, [place]      PDMP not reviewed this encounter.   Particia Nearing, [place] 11/08/23 1600

## 2023-11-08 NOTE — ED Triage Notes (Signed)
Pt reports boil on his lower abdomen causing pain and discomfort from it.

## 2023-11-30 DIAGNOSIS — Z419 Encounter for procedure for purposes other than remedying health state, unspecified: Secondary | ICD-10-CM | POA: Diagnosis not present

## 2023-12-31 DIAGNOSIS — Z419 Encounter for procedure for purposes other than remedying health state, unspecified: Secondary | ICD-10-CM | POA: Diagnosis not present

## 2024-01-28 DIAGNOSIS — Z419 Encounter for procedure for purposes other than remedying health state, unspecified: Secondary | ICD-10-CM | POA: Diagnosis not present

## 2024-03-10 DIAGNOSIS — Z419 Encounter for procedure for purposes other than remedying health state, unspecified: Secondary | ICD-10-CM | POA: Diagnosis not present

## 2024-04-09 DIAGNOSIS — Z419 Encounter for procedure for purposes other than remedying health state, unspecified: Secondary | ICD-10-CM | POA: Diagnosis not present

## 2024-05-10 DIAGNOSIS — Z419 Encounter for procedure for purposes other than remedying health state, unspecified: Secondary | ICD-10-CM | POA: Diagnosis not present

## 2024-06-09 DIAGNOSIS — Z419 Encounter for procedure for purposes other than remedying health state, unspecified: Secondary | ICD-10-CM | POA: Diagnosis not present

## 2024-07-10 DIAGNOSIS — Z419 Encounter for procedure for purposes other than remedying health state, unspecified: Secondary | ICD-10-CM | POA: Diagnosis not present

## 2024-07-31 ENCOUNTER — Ambulatory Visit: Payer: Self-pay

## 2024-07-31 NOTE — Telephone Encounter (Signed)
 FYI Only or Action Required?: FYI only for provider.  Patient was last seen in primary care on N/A, new patient.  Called Nurse Triage reporting Facial Swelling.  Symptoms began 3-4 weeks ago; also states had similar lumps/swelling 10 years ago.  Interventions attempted: Nothing.  Symptoms are: 3 localized lumps/swelling along chin/goatee ranging in size from a quarter to  (possible skin infection or pimples) and tender to touch gradually worsening.  Triage Disposition: See Physician Within 24 Hours- patient scheduled for new patient appt January 2026 with [facility], advised to be seen sooner and offered telehealth, mobile medicine or urgent care and mother states she knows where she can take him to be seen if not today, soon  Patient/caregiver understands and will follow disposition?: Unsure          Copied from CRM [id number]. Topic: Clinical - Red Word Triage >> Jul 31, 2024 11:01 AM Precious C wrote: [facility] that prompted transfer to Nurse Triage: SWELLING   Patient's mother called reporting a swollen lump under the patient's chin. She stated that the lump appeared suddenly but is not causing pain. Patient is not experiencing difficulty swallowing, and there was no animal bite or injury associated. Mother expressed concern regarding the new onset of the swelling. Reason for Disposition  Face swelling is painful to touch  Answer Assessment - Initial Assessment Questions Mother and patient on the phone for triage.  1. ONSET: When did the swelling start? (e.g., minutes, hours, days)     3-4 weeks ago. He states over the past  years he states he noticed a bump there before.  2. LOCATION: What part of the face is swollen? (e.g., cheek, entire face, jaw joint area, under jaw)     2 bumps under the chin, he states the larger of the 2 is about 2 inches or the size of a quarter. He states the smaller one is like a pea sized. Then a 3rd bump along the line of his goatee near his cheek.  He describes them as sore knots under his skin.  3. SEVERITY: How swollen is it?     Localized.  4. ITCHING: Is there any itching? If Yes, ask: How much?   (Scale 1-10; mild, moderate or severe)     No.  5. PAIN: Is the swelling painful to touch? If Yes, ask: How painful is it?   (Scale 0-10; mild, moderate or severe)     Yes, soreness and pressure when pushing on it. Moderate.  6. FEVER: Do you have a fever? If Yes, ask: What is it, how was it measured, and when did it start?      No.  7. CAUSE: What do you think is causing the face swelling?     Unsure.  8. NEW MEDICINES: Have there been any new medicines started recently?     No.  9. RECURRENT SYMPTOM: Have you had face swelling before? If Yes, ask: When was the last time? What happened that time?     He states last time it started was 10 years ago but it did not swell up this big. He states that time he was able to squeeze something out of it.  10. OTHER SYMPTOMS: Do you have any other symptoms? (e.g., leg swelling, toothache)       Denies difficulty swallowing or breathing, facial or tongue swelling, redness, pus drainage.  11. PREGNANCY: Is there any chance you are pregnant? When was your last menstrual period?  N/A.  Protocols used: Face Swelling-A-AH

## 2024-08-01 ENCOUNTER — Encounter: Payer: Self-pay | Admitting: Physician Assistant

## 2024-08-01 ENCOUNTER — Ambulatory Visit: Admitting: Physician Assistant

## 2024-08-01 VITALS — BP 118/74 | HR 79 | Temp 99.0°F | Ht 73.0 in | Wt 304.0 lb

## 2024-08-01 DIAGNOSIS — R220 Localized swelling, mass and lump, head: Secondary | ICD-10-CM | POA: Diagnosis not present

## 2024-08-01 DIAGNOSIS — Z13220 Encounter for screening for lipoid disorders: Secondary | ICD-10-CM | POA: Diagnosis not present

## 2024-08-01 DIAGNOSIS — Z7689 Persons encountering health services in other specified circumstances: Secondary | ICD-10-CM

## 2024-08-01 NOTE — Assessment & Plan Note (Addendum)
 Soft tissue mass under chin for ten years, recently enlarged. Firm, round, subcutaneous, tender, no acute infection signs. Likely a cyst like structure, however due to location, will proceed with imaging. Discussed risks of infection with improper manipulation and benefits of complete removal. Referral to dermatology discussed for removal.   - Refer to dermatology for evaluation and excision. - Tylenol  and ibuprofen as needed for pain.  - Advise monitoring for infection signs: fever, erythema, warmth, discharge; seek immediate care if present. - Discussed with supervising physician about dermatology referral and imaging options like CT or ultrasound.

## 2024-08-01 NOTE — Progress Notes (Signed)
 New Patient Office Visit  Subjective    Patient ID: Nicholas Shannon, male    DOB: 06/20/1996  Age: 28 y.o. MRN: [id number]  CC:  Chief Complaint  Patient presents with   bump underleft side chin   requesting full labs    HPI Nicholas Shannon presents to establish care  Discussed the use of AI scribe software for clinical note transcription with the patient, who gave verbal consent to proceed.  History of Present Illness Nicholas Shannon is a 28 year old male who presents with a lump under his chin.  The lump under his chin has been present for approximately ten years, with a recent increase in size over the past month. It is larger and tender upon palpation, without discharge, redness, or fever. He suspects another lump near his mouth and describes his skin as problematic.  There is no difficulty with chewing, tongue movement, or swallowing. He has not experienced recent fevers or systemic symptoms.  He denies significant past medical history or regular use of medications and has no known medication allergies. He works the third shift at [employer], providing regular physical activity, and cares for his two-year-old son. He reports sleeping well. He is requesting standard lab work today.     Outpatient Encounter Medications as of 08/01/2024  Medication Sig   nystatin  cream (MYCOSTATIN ) Apply to affected area 2 times daily   [DISCONTINUED] cephALEXin  (KEFLEX ) 500 MG capsule Take 1 capsule (500 mg total) by mouth 2 (two) times daily.   [DISCONTINUED] cyclobenzaprine  (FLEXERIL ) 10 MG tablet Take 1 tablet (10 mg total) by mouth 2 (two) times daily as needed for muscle spasms.   [DISCONTINUED] doxycycline  (VIBRAMYCIN ) 100 MG capsule Take 1 capsule (100 mg total) by mouth 2 (two) times daily.   [DISCONTINUED] oseltamivir  (TAMIFLU ) 75 MG capsule Take 1 capsule (75 mg total) by mouth every 12 (twelve) hours.   No facility-administered encounter medications on file as of 08/01/2024.    History  reviewed. No pertinent past medical history.  History reviewed. No pertinent surgical history.  Family History  Problem Relation Age of Onset   Diverticulitis Mother    Sarcoidosis Father     Social History   Socioeconomic History   Marital status: Single    Spouse name: Not on file   Number of children: Not on file   Years of education: Not on file   Highest education level: Not on file  Occupational History   Not on file  Tobacco Use   Smoking status: Every Day    Current packs/day: 0.25    Types: Cigarettes   Smokeless tobacco: Never  Substance and Sexual Activity   Alcohol use: Yes    Comment: occas   Drug use: No   Sexual activity: Not on file  Other Topics Concern   Not on file  Social History Narrative   Not on file   Social Drivers of Health   Financial Resource Strain: Not on file  Food Insecurity: Not on file  Transportation Needs: Not on file  Physical Activity: Not on file  Stress: Not on file  Social Connections: Not on file  Intimate Partner Violence: Not on file    Review of Systems  Constitutional:  Negative for chills, fever and malaise/fatigue.  Eyes:  Negative for blurred vision and double vision.  Respiratory:  Negative for cough and shortness of breath.   Cardiovascular:  Negative for chest pain and palpitations.  Musculoskeletal:  Negative for joint pain and myalgias.  Skin:  Soft tissue mass- under chin   Neurological:  Negative for dizziness and headaches.        Objective    BP 118/74   Pulse 79   Temp 99 F (37.2 C)   Ht 6' 1 (1.854 m)   Wt (!) 304 lb (137.9 kg)   SpO2 97%   BMI 40.11 kg/m   Physical Exam Constitutional:      General: He is not in acute distress.    Appearance: Normal appearance. He is obese. He is not ill-appearing.  HENT:     Head: Normocephalic and atraumatic.     Mouth/Throat:     Mouth: Mucous membranes are moist.     Pharynx: Oropharynx is clear.  Eyes:     Extraocular Movements:  Extraocular movements intact.     Conjunctiva/sclera: Conjunctivae normal.  Neck:   Cardiovascular:     Rate and Rhythm: Normal rate and regular rhythm.     Heart sounds: Normal heart sounds. No murmur heard. Pulmonary:     Effort: Pulmonary effort is normal.     Breath sounds: Normal breath sounds. No wheezing or rales.  Musculoskeletal:     Cervical back: Normal range of motion and neck supple. No rigidity. Normal range of motion.  Lymphadenopathy:     Cervical: No cervical adenopathy.  Skin:    General: Skin is warm and dry.  Neurological:     General: No focal deficit present.     Mental Status: He is alert and oriented to person, place, and time.  Psychiatric:        Mood and Affect: Mood normal.        Behavior: Behavior normal.       Assessment & Plan:  Encounter to establish care  Mass of submandibular region Assessment & Plan: Soft tissue mass under chin for ten years, recently enlarged. Firm, round, subcutaneous, tender, no acute infection signs. Likely a cyst like structure, however due to location, will proceed with imaging. Discussed risks of infection with improper manipulation and benefits of complete removal. Referral to dermatology discussed for removal.   - Refer to dermatology for evaluation and excision. - Tylenol  and ibuprofen as needed for pain.  - Advise monitoring for infection signs: fever, erythema, warmth, discharge; seek immediate care if present. - Discussed with supervising physician about dermatology referral and imaging options like CT or ultrasound.   Orders: -     US  SOFT TISSUE HEAD & NECK (NON-THYROID) -     Ambulatory referral to Dermatology -     CBC with Differential/Platelet -     CMP14+EGFR  Screening for lipid disorders -     Lipid panel    Return if symptoms worsen or fail to improve.   Charmaine Demere Dotzler, PA-C

## 2024-08-10 DIAGNOSIS — Z419 Encounter for procedure for purposes other than remedying health state, unspecified: Secondary | ICD-10-CM | POA: Diagnosis not present

## 2024-08-13 ENCOUNTER — Ambulatory Visit (HOSPITAL_COMMUNITY): Admission: RE | Admit: 2024-08-13 | Source: Ambulatory Visit

## 2024-08-21 DIAGNOSIS — Z6839 Body mass index (BMI) 39.0-39.9, adult: Secondary | ICD-10-CM | POA: Diagnosis not present

## 2024-08-21 DIAGNOSIS — J320 Chronic maxillary sinusitis: Secondary | ICD-10-CM | POA: Diagnosis not present

## 2024-08-21 DIAGNOSIS — L723 Sebaceous cyst: Secondary | ICD-10-CM | POA: Diagnosis not present

## 2024-12-03 ENCOUNTER — Ambulatory Visit: Payer: Self-pay | Admitting: Family Medicine
